# Patient Record
Sex: Male | Born: 2000 | Race: Black or African American | Hispanic: No | Marital: Single | Smoking: Never smoker
Health system: Southern US, Community
[De-identification: ages and names within clinical notes are randomized; demographics above are authoritative.]

---
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
## 2020-02-17 ENCOUNTER — Encounter (HOSPITAL_COMMUNITY): Payer: Self-pay | Admitting: Emergency Medicine

## 2020-02-17 ENCOUNTER — Emergency Department (HOSPITAL_COMMUNITY): Payer: Medicaid Other

## 2020-02-17 ENCOUNTER — Emergency Department (HOSPITAL_COMMUNITY)
Admission: EM | Admit: 2020-02-17 | Discharge: 2020-02-17 | Disposition: A | Discharge status: Home / Self Care | Payer: Medicaid Other | Attending: Emergency Medicine | Admitting: Emergency Medicine

## 2020-02-17 DIAGNOSIS — Z79899 Other long term (current) drug therapy: Secondary | ICD-10-CM | POA: Insufficient documentation

## 2020-02-17 DIAGNOSIS — Y929 Unspecified place or not applicable: Secondary | ICD-10-CM | POA: Diagnosis not present

## 2020-02-17 DIAGNOSIS — R454 Irritability and anger: Secondary | ICD-10-CM

## 2020-02-17 DIAGNOSIS — W2209XA Striking against other stationary object, initial encounter: Secondary | ICD-10-CM | POA: Insufficient documentation

## 2020-02-17 DIAGNOSIS — S6991XA Unspecified injury of right wrist, hand and finger(s), initial encounter: Secondary | ICD-10-CM | POA: Diagnosis present

## 2020-02-17 DIAGNOSIS — S62324A Displaced fracture of shaft of fourth metacarpal bone, right hand, initial encounter for closed fracture: Secondary | ICD-10-CM | POA: Insufficient documentation

## 2020-02-17 DIAGNOSIS — Y9389 Activity, other specified: Secondary | ICD-10-CM | POA: Diagnosis not present

## 2020-02-17 DIAGNOSIS — S62326A Displaced fracture of shaft of fifth metacarpal bone, right hand, initial encounter for closed fracture: Secondary | ICD-10-CM | POA: Diagnosis not present

## 2020-02-17 DIAGNOSIS — Y999 Unspecified external cause status: Secondary | ICD-10-CM | POA: Insufficient documentation

## 2020-02-17 MED ORDER — OXYCODONE-ACETAMINOPHEN 5-325 MG PO TABS: 1.0000 | ORAL_TABLET | Freq: Four times a day (QID) | ORAL | 0 refills | Status: AC | PRN

## 2020-02-17 MED ORDER — OXYCODONE-ACETAMINOPHEN 5-325 MG PO TABS
1.0000 | ORAL_TABLET | ORAL | Status: DC | PRN
  Administered 2020-02-17: 1 via ORAL
  Filled 2020-02-17: qty 1

## 2020-02-17 NOTE — Discharge Instructions (Addendum)
It is very important that you elevate your arm above your heart as much as possible.  This will help with swelling and the heartbeat/throbbing type pain.    Please take Ibuprofen (Advil, motrin) and Tylenol (acetaminophen) to relieve your pain.  You may take up to 600 MG (3 pills) of normal strength ibuprofen every 8 hours as needed.  In between doses of ibuprofen you make take tylenol, up to 1,000 mg (two extra strength pills).  Do not take more than 3,000 mg tylenol in a 24 hour period.  Please check all medication labels as many medications such as pain and cold medications may contain tylenol.  Do not drink alcohol while taking these medications.  Do not take other NSAID'S while taking ibuprofen (such as aleve or naproxen).  Please take ibuprofen with food to decrease stomach upset.  Today you received medications that may make you sleepy or impair your ability to make decisions.  For the next 24 hours please do not drive, operate heavy machinery, care for a small child with out another adult present, or perform any activities that may cause harm to you or someone else if you were to fall asleep or be impaired.   You are being prescribed a medication which may make you sleepy. Please follow up of listed precautions for at least 24 hours after taking one dose.  Do not get your splint wet.  If you get it wet you need to get it replaced.

## 2020-02-17 NOTE — ED Triage Notes (Signed)
Pt arrives to ED after punching a metal pole and now has a deformity to right hand. ICE pack applied. Radial pulses intact, moventment and sensation in intact to fingers.

## 2020-02-17 NOTE — Progress Notes (Signed)
Orthopedic Tech Progress Note Patient Details:  Ryan Huffman. 2000/11/28 [id number]  Ortho Devices Type of Ortho Device: Arm sling, Ulna gutter splint Ortho Device/Splint Location: RUE Ortho Device/Splint Interventions: Ordered, Application   Post Interventions Patient Tolerated: Well Instructions Provided: Care of device   Donald Pore 02/17/2020, 4:21 PM

## 2020-02-17 NOTE — ED Provider Notes (Signed)
[facility] EMERGENCY DE[facility]MENT Provider Note   CSN: [id number] Arrival date & time: 02/17/20  1417     History Chief Complaint  Patient presents with  . Hand Injury    Ryan Huffman. is a 19 y.o. male with no pertinent past medical history who presents today for evaluation of a right hand injury.  He is right-hand dominant.  He reports that about 1 hour prior to arrival he punched a metal pole out of "anger" with his right hand.  He denies any other injuries.  He denies any wounds, scrapes, or abrasions.  No interventions tried prior to arrival.  He denies having seen a orthopedist or hand specialist in the past 3 years.    HPI     History reviewed. No pertinent past medical history.  There are no problems to display for this patient.   No past surgical history on file.     No family history on file.  Social History   Tobacco Use  . Smoking status: Never Smoker  Substance Use Topics  . Alcohol use: No  . Drug use: No    Home Medications Prior to Admission medications   Medication Sig Start Date End Date Taking? Authorizing Provider  amoxicillin-clavulanate (AUGMENTIN) 875-125 MG tablet Take 1 tablet by mouth every 12 (twelve) hours. 01/14/19   Antonietta Breach, PA-C  cetirizine (ZYRTEC) 10 MG tablet Take 10 mg by mouth daily.      [provider]  ibuprofen (ADVIL) 600 MG tablet Take 1 tablet (600 mg total) by mouth every 6 (six) hours as needed for mild pain or moderate pain. 01/14/19   Antonietta Breach, PA-C  oxyCODONE-acetaminophen (PERCOCET/ROXICET) 5-325 MG tablet Take 1 tablet by mouth every 6 (six) hours as needed for severe pain. 02/17/20   Lorin Glass, PA-C    Allergies    Iodine  Review of Systems   Review of Systems  Constitutional: Negative for chills and fever.  Musculoskeletal: Positive for joint swelling.  Skin: Negative for color change, rash and wound.  Psychiatric/Behavioral: Negative for agitation.  All other  systems reviewed and are negative.   Physical Exam Updated Vital Signs BP 125/62 (BP Location: Left Arm)   Pulse 78   Temp 98.5 F (36.9 C) (Oral)   Resp 18   Ht 6' (1.829 m)   Wt 63.5 kg   SpO2 100%   BMI 18.99 kg/m   Physical Exam Vitals and nursing note reviewed.  Constitutional:      General: He is not in acute distress. HENT:     Head: Normocephalic and atraumatic.  Cardiovascular:     Pulses: Normal pulses.     Comments: 2+ Right radial pulse.  Capillary refill to fingers on right hand is less than 2 seconds Musculoskeletal:     Comments: There is obvious edema over the ulnar aspect of the right hand.  There is no pain or tenderness to palpation over the right anatomic snuffbox.  Patient is unable to fully make a fist with his right hand.  No pain crepitus or deformity in right wrist, forearm elbow upper arm or shoulder.  Skin:    Comments: No skin breaks, tears, lacerations or abrasions present over the right hand  Neurological:     Mental Status: He is alert.     Sensory: No sensory deficit (Sensation intact to light touch to fingers on right hand).     ED Results / Procedures / Treatments   Labs (all  labs ordered are listed, but only abnormal results are displayed) Labs Reviewed - No data to display  EKG None  Radiology DG Hand Complete Right  Result Date: 02/17/2020 CLINICAL DATA:  Right hand deformity after injury. EXAM: RIGHT HAND - COMPLETE 3+ VIEW COMPARISON:  None. FINDINGS: Moderately angulated fractures are seen involving the fourth and fifth metacarpals. No other bony abnormality is noted. Joint spaces are intact. No soft tissue abnormality is noted. IMPRESSION: Moderately angulated fourth and fifth metacarpal fractures. Electronically Signed   By: Lupita Raider M.D.   On: 02/17/2020 15:16    Procedures Procedures (including critical care time)  Medications Ordered in ED Medications - No data to display  ED Course  I have reviewed the triage  vital signs and the nursing notes.  Pertinent labs & imaging results that were available during my care of the patient were reviewed by me and considered in my medical decision making (see chart for details).  Clinical Course as of Feb 17 2131  Fri Feb 17, 2020  1507 Atempted to see patient, in x-ray   [EH]  1552 Per Charma Igo PA patient to have ulnar gutter and follow up with Dr. Izora Ribas.  No need for attempt at reduction in ER as patient will need pins/OR.    [EH]    Clinical Course User Index [EH] [facility]   MDM Rules/Calculators/A&P                      Patient is an 19 year old man who presents today for evaluation after he punched a metal pole shortly prior to arrival.  He states he punched it out of anger.  On exam obvious edema to the ulnar aspect of the right hand with limited range of motion of the fingers of the right hand secondary to pain and swelling.  X-rays were obtained showing displaced fractures of the right fourth and fifth metacarpals.  Orthopedics is consulted, per PA [facility] patient follow-up as an outpatient with Dr. Kristine Linea.  Ulnar gutter splint and sling.  No indication of open fracture at this time. Patient's pain was treated in the emergency room with Percocet, after we discussed risks of narcotic pain medication and [place] PMP is consulted he is given a prescription for a short course of Percocet with instructions on using ibuprofen and Tylenol as main pain medicine and reserving Percocet for breakthrough and severe pain.  The importance of elevation is discussed.  Return precautions were discussed with patient who states their understanding.  At the time of discharge patient denied any unaddressed complaints or concerns.  Patient is agreeable for discharge home.  Note: Portions of this report may have been transcribed using voice recognition software. Every effort was made to ensure accuracy; however, inadvertent computerized  transcription errors may be present   Final Clinical Impression(s) / ED Diagnoses Final diagnoses:  Closed displaced fracture of shaft of fifth metacarpal bone of right hand, initial encounter  Closed displaced fracture of shaft of fourth metacarpal bone of right hand, initial encounter  Anger reaction    Rx / DC Orders ED Discharge Orders         Ordered    oxyCODONE-acetaminophen (PERCOCET/ROXICET) 5-325 MG tablet  Every 6 hours PRN     02/17/20 1617           Ryan Gong, Cordelia Poche 02/17/20 2132    Pricilla Loveless, MD 02/18/20 [phone]

## 2020-02-17 NOTE — ED Notes (Signed)
Patient given discharge instructions. Questions were answered. Patient verbalized understanding of discharge instructions and care at home.  

## 2020-02-17 NOTE — Consult Note (Signed)
Reason for Consult:Right 4/5 MC fxs Referring Physician: Patrice Paradise. is an 19 y.o. male.  HPI: Ryan Huffman punched a metal pole in anger and hurt his right hand. He came to the ED for evaluation where x-rays showed 4th and 5th MC fxs and hand surgery was consulted. He is currently unemployed and RHD.  History reviewed. No pertinent past medical history.  No past surgical history on file.  No family history on file.  Social History:  reports that he has never smoked. He does not have any smokeless tobacco history on file. He reports that he does not drink alcohol or use drugs.  Allergies:  Allergies  Allergen Reactions  . Iodine     Medications: I have reviewed the patient's current medications.  No results found for this or any previous visit (from the past 48 hour(s)).  DG Hand Complete Right  Result Date: 02/17/2020 CLINICAL DATA:  Right hand deformity after injury. EXAM: RIGHT HAND - COMPLETE 3+ VIEW COMPARISON:  None. FINDINGS: Moderately angulated fractures are seen involving the fourth and fifth metacarpals. No other bony abnormality is noted. Joint spaces are intact. No soft tissue abnormality is noted. IMPRESSION: Moderately angulated fourth and fifth metacarpal fractures. Electronically Signed   By: Lupita Raider M.D.   On: 02/17/2020 15:16    Review of Systems  HENT: Negative for ear discharge, ear pain, hearing loss and tinnitus.   Eyes: Negative for photophobia and pain.  Respiratory: Negative for cough and shortness of breath.   Cardiovascular: Negative for chest pain.  Gastrointestinal: Negative for abdominal pain, nausea and vomiting.  Genitourinary: Negative for dysuria, flank pain, frequency and urgency.  Musculoskeletal: Positive for arthralgias (Right hand). Negative for back pain, myalgias and neck pain.  Neurological: Negative for dizziness and headaches.  Hematological: Does not bruise/bleed easily.  Psychiatric/Behavioral: The patient is not  nervous/anxious.    Blood pressure 128/78, pulse 78, temperature 98.9 F (37.2 C), temperature source Oral, resp. rate 18, height 6' (1.829 m), weight 63.5 kg, SpO2 100 %. Physical Exam  Constitutional: He appears well-developed and well-nourished. No distress.  HENT:  Head: Normocephalic and atraumatic.  Eyes: Conjunctivae are normal. Right eye exhibits no discharge. Left eye exhibits no discharge. No scleral icterus.  Cardiovascular: Normal rate and regular rhythm.  Respiratory: Effort normal. No respiratory distress.  Musculoskeletal:     Cervical back: Normal range of motion.     Comments: Right shoulder, elbow, wrist, digits- no skin wounds, ulnar hand swollen, mod TTP, no instability, no blocks to motion  Sens  Ax/R/M/U intact  Mot   Ax/ R/ PIN/ M/ AIN/ U intact  Rad 2+  Neurological: He is alert.  Skin: Skin is warm and dry. He is not diaphoretic.  Psychiatric: He has a normal mood and affect. His behavior is normal.    Assessment/Plan: Right 4th/5th MC fxs -- Ulnar gutter splint and f/u with Dr. Izora Ribas next week. Will likely need CRPP on elective basis. Tobacco use    Freeman Caldron, PA-C Orthopedic Surgery [phone] 02/17/2020, 3:53 PM

## 2020-03-05 DIAGNOSIS — S62324A Displaced fracture of shaft of fourth metacarpal bone, right hand, initial encounter for closed fracture: Secondary | ICD-10-CM | POA: Diagnosis not present

## 2020-03-05 DIAGNOSIS — S62326A Displaced fracture of shaft of fifth metacarpal bone, right hand, initial encounter for closed fracture: Secondary | ICD-10-CM | POA: Diagnosis not present

## 2020-03-06 DIAGNOSIS — S62324A Displaced fracture of shaft of fourth metacarpal bone, right hand, initial encounter for closed fracture: Secondary | ICD-10-CM | POA: Diagnosis not present

## 2020-03-06 DIAGNOSIS — S62326A Displaced fracture of shaft of fifth metacarpal bone, right hand, initial encounter for closed fracture: Secondary | ICD-10-CM | POA: Diagnosis not present

## 2020-03-06 DIAGNOSIS — S62032A Displaced fracture of proximal third of navicular [scaphoid] bone of left wrist, initial encounter for closed fracture: Secondary | ICD-10-CM | POA: Diagnosis not present

## 2020-03-16 ENCOUNTER — Emergency Department (HOSPITAL_COMMUNITY)
Admission: EM | Admit: 2020-03-16 | Discharge: 2020-03-16 | Disposition: A | Discharge status: Home / Self Care | Payer: Medicaid Other | Attending: Emergency Medicine | Admitting: Emergency Medicine

## 2020-03-16 DIAGNOSIS — Z5321 Procedure and treatment not carried out due to patient leaving prior to being seen by health care provider: Secondary | ICD-10-CM | POA: Insufficient documentation

## 2020-03-16 DIAGNOSIS — X58XXXD Exposure to other specified factors, subsequent encounter: Secondary | ICD-10-CM | POA: Insufficient documentation

## 2020-03-16 DIAGNOSIS — S6990XD Unspecified injury of unspecified wrist, hand and finger(s), subsequent encounter: Secondary | ICD-10-CM | POA: Insufficient documentation

## 2020-03-16 DIAGNOSIS — Z4800 Encounter for change or removal of nonsurgical wound dressing: Secondary | ICD-10-CM | POA: Diagnosis not present

## 2020-03-16 NOTE — ED Notes (Signed)
Unable to locate pt in lobby. Called x3

## 2020-03-21 DIAGNOSIS — S62326D Displaced fracture of shaft of fifth metacarpal bone, right hand, subsequent encounter for fracture with routine healing: Secondary | ICD-10-CM | POA: Diagnosis not present

## 2021-07-20 ENCOUNTER — Other Ambulatory Visit: Payer: Self-pay

## 2021-07-20 ENCOUNTER — Emergency Department (HOSPITAL_COMMUNITY)
Admission: EM | Admit: 2021-07-20 | Discharge: 2021-07-20 | Discharge status: Against Medical Advice | Payer: BC Managed Care – PPO | Attending: Emergency Medicine | Admitting: Emergency Medicine

## 2021-07-20 DIAGNOSIS — Z20822 Contact with and (suspected) exposure to covid-19: Secondary | ICD-10-CM | POA: Insufficient documentation

## 2021-07-20 DIAGNOSIS — R509 Fever, unspecified: Secondary | ICD-10-CM | POA: Diagnosis not present

## 2021-07-20 DIAGNOSIS — B9789 Other viral agents as the cause of diseases classified elsewhere: Secondary | ICD-10-CM | POA: Diagnosis not present

## 2021-07-20 DIAGNOSIS — J029 Acute pharyngitis, unspecified: Secondary | ICD-10-CM | POA: Diagnosis not present

## 2021-07-20 DIAGNOSIS — J028 Acute pharyngitis due to other specified organisms: Secondary | ICD-10-CM | POA: Diagnosis not present

## 2021-07-20 LAB — RESP PANEL BY RT-PCR (FLU A&B, COVID) ARPGX2
Influenza A by PCR: NEGATIVE
Influenza B by PCR: NEGATIVE
SARS Coronavirus 2 by RT PCR: NEGATIVE

## 2021-07-20 LAB — GROUP A STREP BY PCR: Group A Strep by PCR: NOT DETECTED

## 2021-07-20 MED ORDER — KETOROLAC TROMETHAMINE 30 MG/ML IJ SOLN
30.0000 mg | Freq: Once | INTRAMUSCULAR | Status: AC
  Administered 2021-07-20: 30 mg via INTRAMUSCULAR
  Filled 2021-07-20: qty 1

## 2021-07-20 MED ORDER — DEXAMETHASONE SODIUM PHOSPHATE 10 MG/ML IJ SOLN
10.0000 mg | Freq: Once | INTRAMUSCULAR | Status: DC
  Filled 2021-07-20: qty 1

## 2021-07-20 MED ORDER — ACETAMINOPHEN 325 MG PO TABS
650.0000 mg | ORAL_TABLET | Freq: Once | ORAL | Status: AC | PRN
  Administered 2021-07-20: 650 mg via ORAL
  Filled 2021-07-20: qty 2

## 2021-07-20 NOTE — ED Notes (Signed)
Patient drinking ice water, unable to take oral temp

## 2021-07-20 NOTE — ED Provider Notes (Signed)
[facility], [place] EMERGENCY DEPARTMENT Provider Note   CSN: [id number] Arrival date & time: 07/20/21  1062     History Chief Complaint  Patient presents with   Sore    Ryan Huffman. is a 20 y.o. male.  Pt presents to the ED today with fever and sore throat.  Pt said sx have been going on for 2 days.  He can drink, but it hurts.        No past medical history on file.  There are no problems to display for this patient.   No past surgical history on file.     No family history on file.  Social History   Tobacco Use   Smoking status: Never  Substance Use Topics   Alcohol use: No   Drug use: No    Home Medications Prior to Admission medications   Medication Sig Start Date End Date Taking? Authorizing Provider  amoxicillin-clavulanate (AUGMENTIN) 875-125 MG tablet Take 1 tablet by mouth every 12 (twelve) hours. 01/14/19   Antony Madura, PA-C  cetirizine (ZYRTEC) 10 MG tablet Take 10 mg by mouth daily.      [provider]  ibuprofen (ADVIL) 600 MG tablet Take 1 tablet (600 mg total) by mouth every 6 (six) hours as needed for mild pain or moderate pain. 01/14/19   Antony Madura, PA-C  oxyCODONE-acetaminophen (PERCOCET/ROXICET) 5-325 MG tablet Take 1 tablet by mouth every 6 (six) hours as needed for severe pain. 02/17/20   Cristina Gong, PA-C    Allergies    Iodine  Review of Systems   Review of Systems  HENT:  Positive for sore throat.   All other systems reviewed and are negative.  Physical Exam Updated Vital Signs BP 128/72 (BP Location: Right Arm)   Pulse 89   Temp (!) 101.6 F (38.7 C) (Oral)   Resp 18   Ht 6\' 1"  (1.854 m)   Wt 63.5 kg   SpO2 99%   BMI 18.47 kg/m   Physical Exam Vitals and nursing note reviewed.  Constitutional:      Appearance: Normal appearance.  HENT:     Head: Normocephalic and atraumatic.     Right Ear: External ear normal.     Left Ear: External ear normal.     Nose: Nose normal.      Mouth/Throat:     Mouth: Mucous membranes are moist.     Pharynx: Posterior oropharyngeal erythema present.  Eyes:     Extraocular Movements: Extraocular movements intact.     Conjunctiva/sclera: Conjunctivae normal.     Pupils: Pupils are equal, round, and reactive to light.  Cardiovascular:     Rate and Rhythm: Normal rate and regular rhythm.     Pulses: Normal pulses.     Heart sounds: Normal heart sounds.  Pulmonary:     Effort: Pulmonary effort is normal.     Breath sounds: Normal breath sounds.  Abdominal:     General: Abdomen is flat. Bowel sounds are normal.     Palpations: Abdomen is soft.  Musculoskeletal:        General: Normal range of motion.     Cervical back: Normal range of motion and neck supple.  Lymphadenopathy:     Cervical: Cervical adenopathy present.  Skin:    General: Skin is warm.     Capillary Refill: Capillary refill takes less than 2 seconds.  Neurological:     General: No focal deficit present.     Mental  Status: He is alert and oriented to person, place, and time.  Psychiatric:        Mood and Affect: Mood normal.        Behavior: Behavior normal.    ED Results / Procedures / Treatments   Labs (all labs ordered are listed, but only abnormal results are displayed) Labs Reviewed  GROUP A STREP BY PCR  RESP PANEL BY RT-PCR (FLU A&B, COVID) [id number]    EKG None  Radiology No results found.  Procedures Procedures   Medications Ordered in ED Medications  dexamethasone (DECADRON) injection 10 mg (10 mg Intramuscular Patient Refused/Not Given 07/20/21 1152)  acetaminophen (TYLENOL) tablet 650 mg (650 mg Oral Given 07/20/21 1039)  ketorolac (TORADOL) 30 MG/ML injection 30 mg (30 mg Intramuscular Given 07/20/21 1153)    ED Course  I have reviewed the triage vital signs and the nursing notes.  Pertinent labs & imaging results that were available during my care of the patient were reviewed by me and considered in my medical decision making  (see chart for details).    MDM Rules/Calculators/A&P                           Pt let the nurse give him the dose of toradol, but refused the decadron.  His strep/covid/flu came back negative.  When I went to tell him, he was gone. He did not tell anyone he was leaving.  He did not receive any d/c instructions.   Final Clinical Impression(s) / ED Diagnoses Final diagnoses:  Viral pharyngitis  Fever in adult    Rx / DC Orders ED Discharge Orders     None        Jacalyn Lefevre, MD 07/20/21 1220

## 2021-07-20 NOTE — ED Notes (Addendum)
MD and RN into room, patient and visitor and not in the room, hallway, BR or WR. They did not notify staff of intent to leave. Patient was stable, A/Ox 4 and noted ambulating in room prior to leaving and during care.

## 2021-07-20 NOTE — ED Notes (Addendum)
Refusing decadron, provider notified

## 2021-07-20 NOTE — ED Triage Notes (Signed)
Pt here POV d/t sore throat X2 days. Endorses fever, ear pain. denies congestion. Unable to drink without pain.

## 2021-09-16 DIAGNOSIS — H0015 Chalazion left lower eyelid: Secondary | ICD-10-CM | POA: Diagnosis not present

## 2021-12-26 ENCOUNTER — Emergency Department (HOSPITAL_BASED_OUTPATIENT_CLINIC_OR_DEPARTMENT_OTHER)
Admission: EM | Admit: 2021-12-26 | Discharge: 2021-12-26 | Disposition: A | Discharge status: Home / Self Care | Payer: BC Managed Care – PPO | Attending: Emergency Medicine | Admitting: Emergency Medicine

## 2021-12-26 ENCOUNTER — Other Ambulatory Visit: Payer: Self-pay

## 2021-12-26 ENCOUNTER — Emergency Department (HOSPITAL_BASED_OUTPATIENT_CLINIC_OR_DEPARTMENT_OTHER): Payer: BC Managed Care – PPO

## 2021-12-26 ENCOUNTER — Encounter (HOSPITAL_BASED_OUTPATIENT_CLINIC_OR_DEPARTMENT_OTHER): Payer: Self-pay | Admitting: Emergency Medicine

## 2021-12-26 DIAGNOSIS — F17200 Nicotine dependence, unspecified, uncomplicated: Secondary | ICD-10-CM | POA: Diagnosis not present

## 2021-12-26 DIAGNOSIS — J189 Pneumonia, unspecified organism: Secondary | ICD-10-CM | POA: Insufficient documentation

## 2021-12-26 DIAGNOSIS — R0789 Other chest pain: Secondary | ICD-10-CM | POA: Diagnosis not present

## 2021-12-26 DIAGNOSIS — R059 Cough, unspecified: Secondary | ICD-10-CM | POA: Diagnosis not present

## 2021-12-26 DIAGNOSIS — R079 Chest pain, unspecified: Secondary | ICD-10-CM | POA: Diagnosis not present

## 2021-12-26 MED ORDER — AZITHROMYCIN 250 MG PO TABS: 250.0000 mg | ORAL_TABLET | Freq: Every day | ORAL | 0 refills | Status: AC

## 2021-12-26 MED ORDER — BENZONATATE 100 MG PO CAPS: 100.0000 mg | ORAL_CAPSULE | Freq: Three times a day (TID) | ORAL | 0 refills | Status: AC

## 2021-12-26 NOTE — ED Provider Notes (Signed)
?[facility] EMERGENCY DEPARTMENT ?Provider Note ? ? ?CSN: [id number] ?Arrival date & time: 12/26/21  1419 ? ?  ? ?History ? ?Chief Complaint  ?Patient presents with  ? Cough  ? Chest Wall Pain  ? ? ?Ryan RhineEric A Mofield Jr. is a 21 y.o. male who presents to the ED for evaluation of left-sided chest pain secondary to cough that has been ongoing for the last few days.  Patient states he had cold and flu symptoms last week to include cough, congestion and runny nose.  He was taking DayQuil and NyQuil at the time but stopped this several days ago once his congestion cleared up.  He has had a lingering cough since then that he says feels "wet" but is nonproductive.  For the last 2 days he has had left-sided chest pain worse with deep inspiration and with coughing.  He denies fevers, abdominal pain, nausea, vomiting and diarrhea.  He has no other complaints. ? ? ?Cough ? ?  ? ?Home Medications ?Prior to Admission medications   ?Medication Sig Start Date End Date Taking? Authorizing Provider  ?azithromycin (ZITHROMAX) 250 MG tablet Take 1 tablet (250 mg total) by mouth daily. Take first 2 tablets together, then 1 every day until finished. 12/26/21  Yes Raynald Blendonklin, Berkley Cronkright R, PA-C  ?benzonatate (TESSALON) 100 MG capsule Take 1 capsule (100 mg total) by mouth every 8 (eight) hours. 12/26/21  Yes Raynald Blendonklin, Zahli Vetsch R, PA-C  ?amoxicillin-clavulanate (AUGMENTIN) 875-125 MG tablet Take 1 tablet by mouth every 12 (twelve) hours. 01/14/19   Antony MaduraHumes, Kelly, PA-C  ?cetirizine (ZYRTEC) 10 MG tablet Take 10 mg by mouth daily.      [provider]  ?ibuprofen (ADVIL) 600 MG tablet Take 1 tablet (600 mg total) by mouth every 6 (six) hours as needed for mild pain or moderate pain. 01/14/19   Antony MaduraHumes, Kelly, PA-C  ?oxyCODONE-acetaminophen (PERCOCET/ROXICET) 5-325 MG tablet Take 1 tablet by mouth every 6 (six) hours as needed for severe pain. 02/17/20   Cristina GongHammond, Elizabeth W, PA-C  ?   ? ?Allergies    ?Iodine   ? ?Review of Systems   ?Review of  Systems  ?Respiratory:  Positive for cough.   ? ?Physical Exam ?Updated Vital Signs ?BP 120/68   Pulse 84   Temp 98.6 ?F (37 ?C) (Oral)   Resp 18   Ht 6\' 1"  (1.854 m)   Wt 63.5 kg   SpO2 98%   BMI 18.47 kg/m?  ?Physical Exam ?Vitals and nursing note reviewed.  ?Constitutional:   ?   General: He is not in acute distress. ?   Appearance: He is not ill-appearing.  ?HENT:  ?   Head: Atraumatic.  ?Eyes:  ?   Conjunctiva/sclera: Conjunctivae normal.  ?Cardiovascular:  ?   Rate and Rhythm: Normal rate and regular rhythm.  ?   Pulses: Normal pulses.  ?   Heart sounds: No murmur heard. ?Pulmonary:  ?   Effort: Pulmonary effort is normal. No respiratory distress.  ?   Breath sounds: Normal breath sounds.  ?   Comments: Wet cough heard in exam room without wheezes, rhonchi or rales on auscultation. ?Abdominal:  ?   General: Abdomen is flat. There is no distension.  ?   Palpations: Abdomen is soft.  ?   Tenderness: There is no abdominal tenderness.  ?Musculoskeletal:     ?   General: Normal range of motion.  ?     Arms: ? ?   Cervical back: Normal range of motion.  ?  Comments: No reproducible chest wall or back pain on palpation  ?Skin: ?   General: Skin is warm and dry.  ?   Capillary Refill: Capillary refill takes less than 2 seconds.  ?Neurological:  ?   General: No focal deficit present.  ?   Mental Status: He is alert.  ?Psychiatric:     ?   Mood and Affect: Mood normal.  ? ? ?ED Results / Procedures / Treatments   ?Labs ?(all labs ordered are listed, but only abnormal results are displayed) ?Labs Reviewed - No data to display ? ?EKG ?None ? ?Radiology ?DG Chest 2 View ? ?Result Date: 12/26/2021 ?CLINICAL DATA:  Cough, chest pain. EXAM: CHEST - 2 VIEW COMPARISON:  None. FINDINGS: The heart size and mediastinal contours are within normal limits. Right lung is clear. Mild left basilar opacity is noted which may represent focal atelectasis or infiltrate. The visualized skeletal structures are unremarkable. IMPRESSION:  Mild left basilar opacity is noted which may represent focal atelectasis or infiltrate. Electronically Signed   By: Lupita Raider M.D.   On: 12/26/2021 15:28   ? ?Procedures ?Procedures  ? ? ?Medications Ordered in ED ?Medications - No data to display ? ?ED Course/ Medical Decision Making/ A&P ?  ?                        ?Medical Decision Making ?Amount and/or Complexity of Data Reviewed ?Radiology: ordered. ? ?Risk ?Prescription drug management. ? ? ?History:  ?Per HPI ?Social determinants of health: none ? ?Initial impression: ? ?This patient presents to the ED for concern of cough, this involves an extensive number of treatment options, and is a complaint that carries with it a high risk of complications and morbidity.   Differential diagnosis for emergent cause of cough includes but is not limited to upper respiratory infection, lower respiratory infection, allergies, asthma, irritants, foreign body, medications such as ACE inhibitors, reflux, asthma, CHF, lung cancer, interstitial lung disease, psychiatric causes, postnasal drip and postinfectious bronchospasm. ? ? ?ED Course: ?This was a 21 year old male who presents to the ED for evaluation of left-sided chest pain secondary to cough that has been worsening over the last couple of days.  Lungs were CTA bilaterally.  Vitals were normal, he was in no acute distress.  Initial differentials for pneumonia versus rib fracture versus muscle strain.  Chest x-ray identified mild left-sided opacities, likely infiltrate.  I considered keeping patient here for IV antibiotics, however given his age, normal vitals and physical presentation, I believe outpatient management is appropriate.   ? ?Disposition: ? ?After consideration of the diagnostic results, physical exam, history and the patients response to treatment feel that the patent would benefit from discharge.   ?Left-sided CAP: At home supportive care measures discussed with patient.  Advised for him to discontinue  smoking, ideally long-term but at least until he is feeling better.  Return precautions were discussed.  All questions were asked and answered and he was discharged home in good condition. ? ? ?Final Clinical Impression(s) / ED Diagnoses ?Final diagnoses:  ?Community acquired pneumonia of left lung, unspecified part of lung  ? ? ?Rx / DC Orders ?ED Discharge Orders   ? ?      Ordered  ?  benzonatate (TESSALON) 100 MG capsule  Every 8 hours       ? 12/26/21 1541  ?  azithromycin (ZITHROMAX) 250 MG tablet  Daily       ? 12/26/21  1541  ? ?  ?  ? ?  ? ? ?  ?Ryan Huffman, Ryan Huffman, [place] ?12/26/21 1620 ? ?  ?Maia Plan, MD ?12/28/21 0000 ? ?

## 2021-12-26 NOTE — ED Triage Notes (Signed)
Patient c/o shortness of breath and left sided chest wall pain x 1 week. States last week he has a cold and has been coughing. Clear mucus with cough.  ?

## 2021-12-26 NOTE — ED Notes (Signed)
Pt. Reports he was sick last week with a cold and has gotten better from the cold. He did home test for COVID and was negative.  Pt. Reports no more runny nose.  Now has worsening cough and L side of chest he feels a pain in the rib area with a deep inhalation. ?

## 2021-12-26 NOTE — Discharge Instructions (Addendum)
You were diagnosed with pneumonia in your left leg on xray. I have sent you in a prescription for an antibiotic that you should pick up today and take as directed.  I have also sent in a cough medication that you can take at night if you are having trouble sleeping.  During the day I would recommend something like Mucinex or Robitussin as a decongestant.  I strongly recommend that you discontinue smoking at least until your infection clears and you are feeling better as it can delay healing and cause more irritation. Follow up with your PCP if your symptoms do not improve. Return to the ED if you become short of breath. ?

## 2022-01-15 DIAGNOSIS — H00014 Hordeolum externum left upper eyelid: Secondary | ICD-10-CM | POA: Diagnosis not present

## 2022-01-15 DIAGNOSIS — N62 Hypertrophy of breast: Secondary | ICD-10-CM | POA: Diagnosis not present

## 2022-01-15 DIAGNOSIS — H00011 Hordeolum externum right upper eyelid: Secondary | ICD-10-CM | POA: Diagnosis not present

## 2022-01-16 ENCOUNTER — Other Ambulatory Visit: Payer: Self-pay | Admitting: Physician Assistant

## 2022-01-16 DIAGNOSIS — N62 Hypertrophy of breast: Secondary | ICD-10-CM

## 2022-01-16 DIAGNOSIS — N6320 Unspecified lump in the left breast, unspecified quadrant: Secondary | ICD-10-CM

## 2022-02-04 ENCOUNTER — Inpatient Hospital Stay: Admission: RE | Admit: 2022-02-04 | Payer: BC Managed Care – PPO | Source: Ambulatory Visit

## 2022-03-07 DIAGNOSIS — Z202 Contact with and (suspected) exposure to infections with a predominantly sexual mode of transmission: Secondary | ICD-10-CM | POA: Diagnosis not present

## 2023-03-02 DIAGNOSIS — N489 Disorder of penis, unspecified: Secondary | ICD-10-CM | POA: Diagnosis not present

## 2023-05-28 IMAGING — CR DG CHEST 2V
2 series · 2 of 2 positions shown · non-contrast
Comparison: None.

CLINICAL DATA: Cough, chest pain.

EXAM:
CHEST - 2 VIEW

[w chest pa]
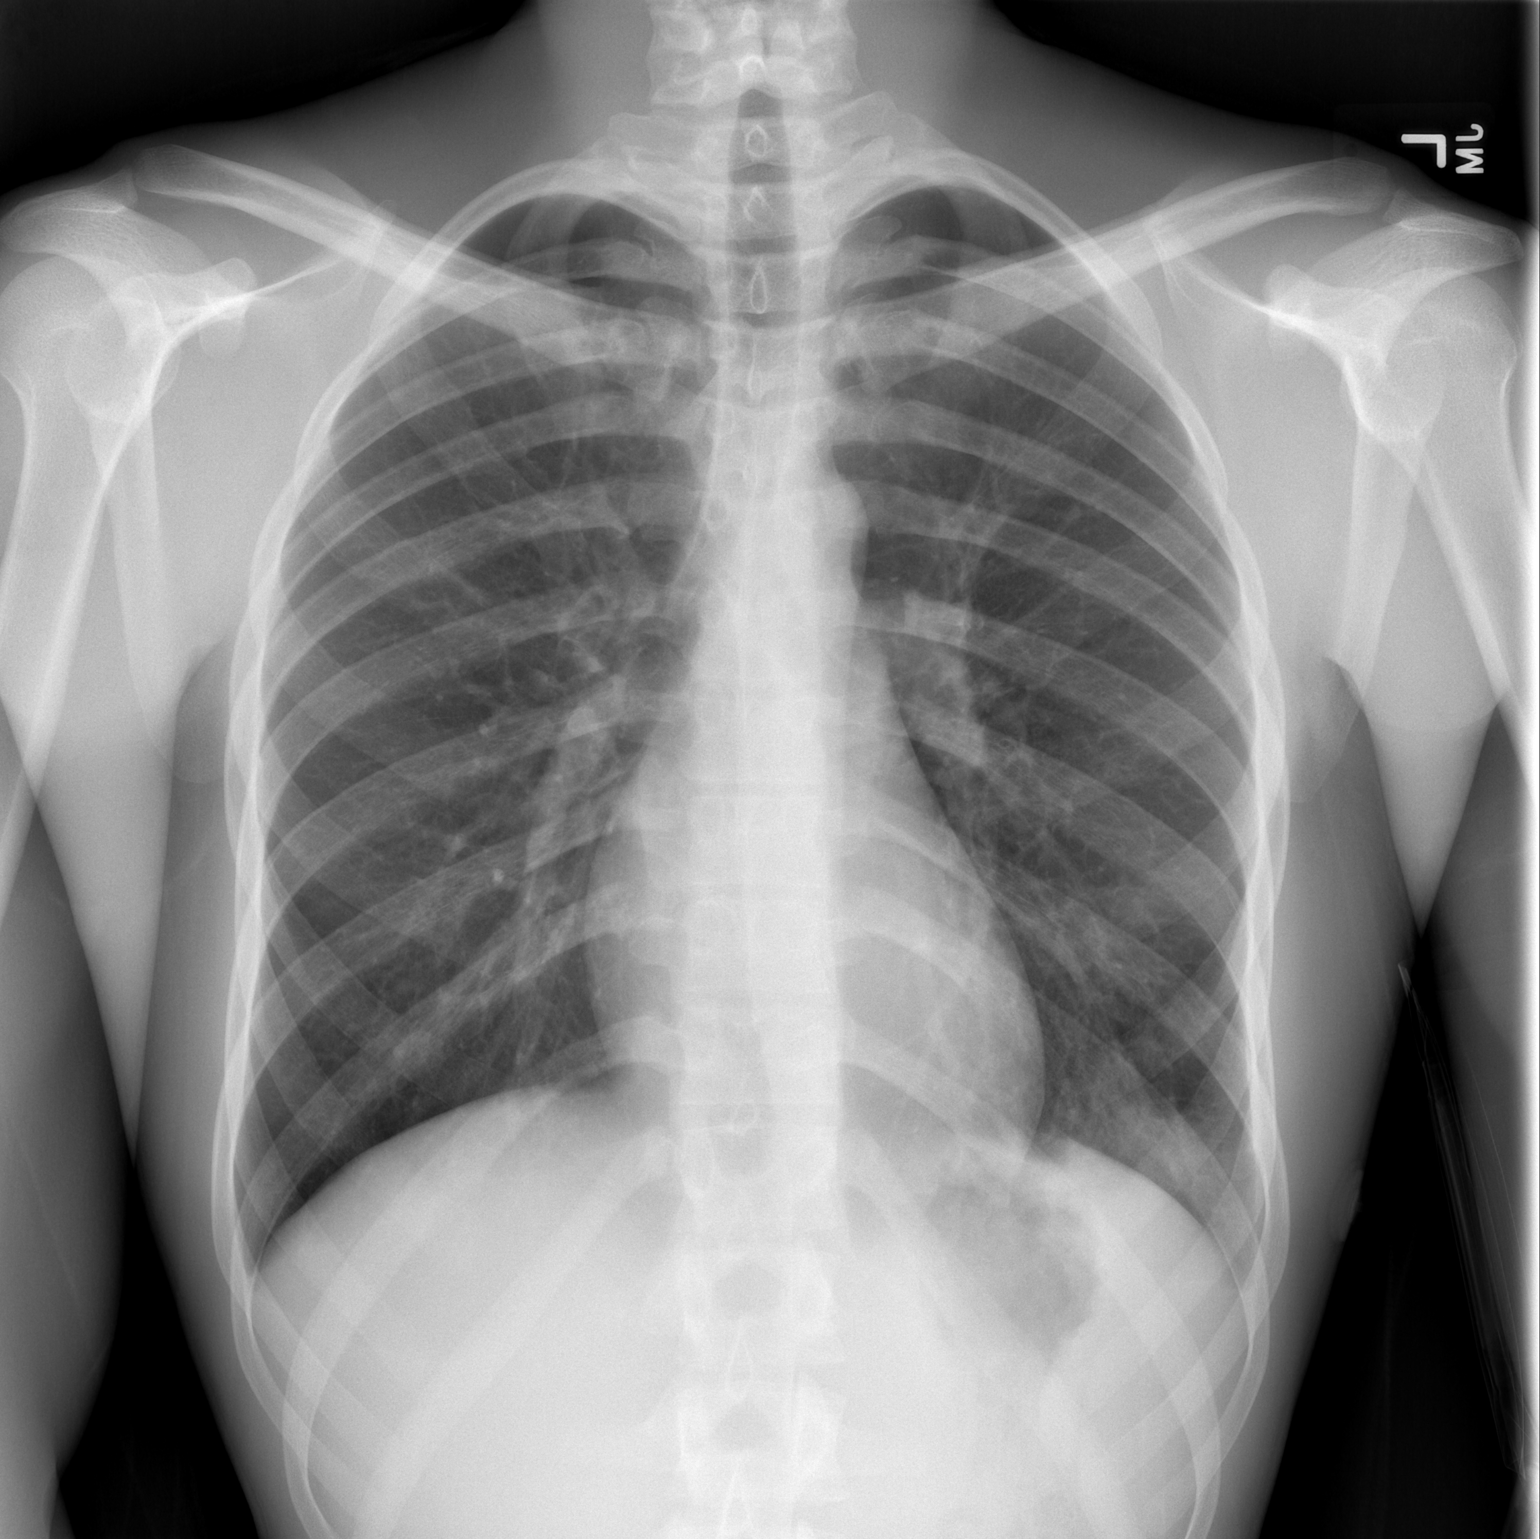

[w chest lat]
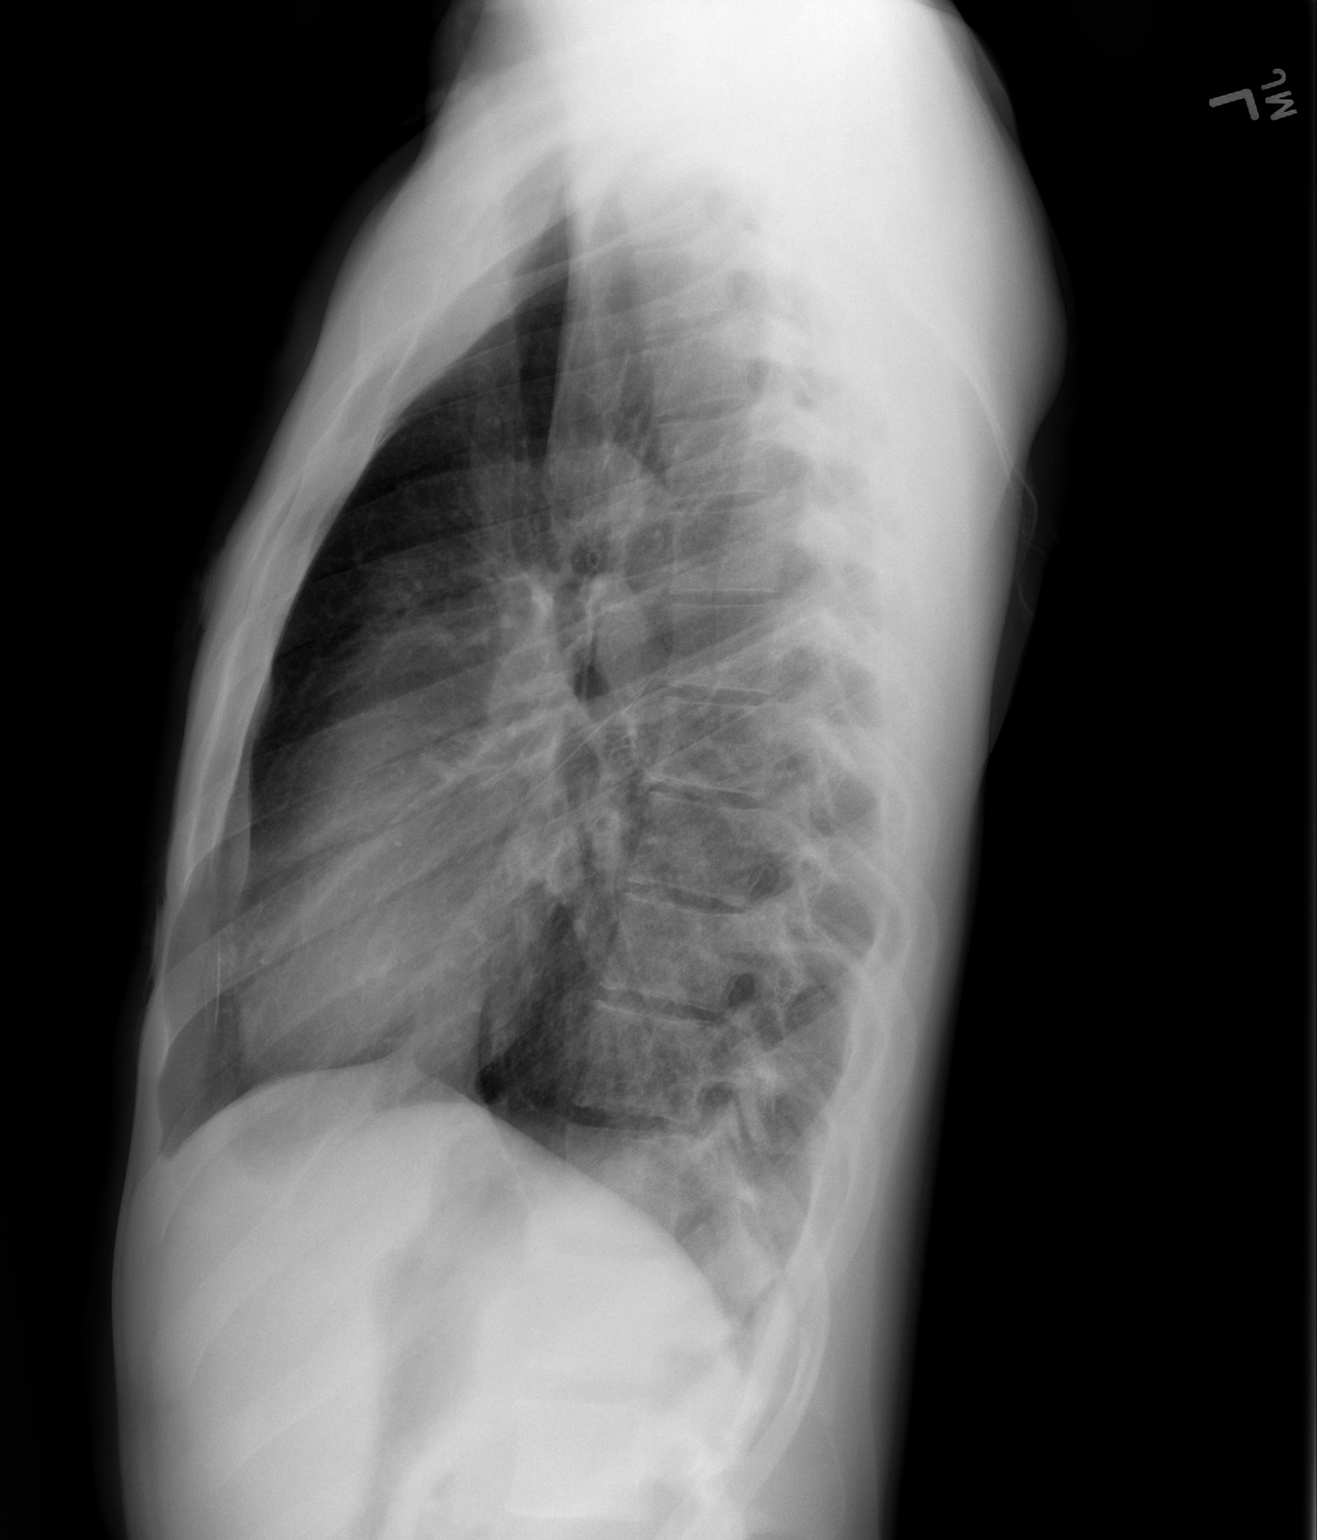

[2 of 2 positions shown; findings below may reference images not displayed]

FINDINGS: The heart size and mediastinal contours are within normal limits.
Right lung is clear. Mild left basilar opacity is noted which may
represent focal atelectasis or infiltrate. The visualized skeletal
structures are unremarkable.
IMPRESSION: Mild left basilar opacity is noted which may represent focal
atelectasis or infiltrate.
